# Patient Record
Sex: Male | Born: 1990
Health system: Southern US, Community
[De-identification: ages and names within clinical notes are randomized; demographics above are authoritative.]

---
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
## 2012-02-12 ENCOUNTER — Emergency Department (HOSPITAL_COMMUNITY)
Admission: EM | Admit: 2012-02-12 | Discharge: 2012-02-12 | Disposition: A | Discharge status: Home / Self Care | Payer: Medicaid Other | Source: Home / Self Care | Attending: Family Medicine | Admitting: Family Medicine

## 2012-02-12 ENCOUNTER — Encounter (HOSPITAL_COMMUNITY): Payer: Self-pay

## 2012-02-12 DIAGNOSIS — R112 Nausea with vomiting, unspecified: Secondary | ICD-10-CM

## 2012-02-12 MED ORDER — ONDANSETRON 4 MG PO TBDP
ORAL_TABLET | ORAL | Status: AC
  Filled 2012-02-12: qty 1

## 2012-02-12 MED ORDER — ONDANSETRON HCL 4 MG PO TABS: 4.0000 mg | ORAL_TABLET | Freq: Three times a day (TID) | ORAL | Status: AC | PRN

## 2012-02-12 MED ORDER — ONDANSETRON 4 MG PO TBDP
4.0000 mg | ORAL_TABLET | Freq: Once | ORAL | Status: AC
  Administered 2012-02-12: 4 mg via ORAL

## 2012-02-12 NOTE — Discharge Instructions (Signed)
Take medication (ondansetron for nausea) as instructed. Consume clear liquids such as water, Sprite, gingerale, light juices for the next 12 to 24 hours, then advance as tolerated to [employer] (bananas, rice, applesauce, toast) and bland things such as soup, crackers, etc. Stop taking medications and return if any blood in stool or any fevers, or you are unable to eat or drink.

## 2012-02-12 NOTE — ED Notes (Signed)
Pt has abd pain and has vomited x 2 today, denies diarrhea.

## 2012-02-12 NOTE — ED Provider Notes (Signed)
History     CSN: [id number]  Arrival date & time 02/12/12  1054   First MD Initiated Contact with Patient 02/12/12 1056      Chief Complaint  Patient presents with  . Emesis    (Consider location/radiation/quality/duration/timing/severity/associated sxs/prior treatment) HPI Comments: Travis Sims presents for evaluation of 2 episodes of vomiting this morning. He reports that he awoke this morning and vomited once. He waited, and then vomited again, prior to leaving his residence for work. He also reports generalized abdominal pain. He denies fever or diarrhea. He has not tried to eat or drink anything.  Patient is a 21 y.o. male presenting with vomiting. The history is provided by the patient.  Emesis  This is a new problem. The current episode started 3 to 5 hours ago. The problem occurs 2 to 4 times per day. The problem has not changed since onset.There has been no fever. Associated symptoms include abdominal pain. Pertinent negatives include no chills, no diarrhea and no fever.    History reviewed. No pertinent past medical history.  History reviewed. No pertinent past surgical history.  History reviewed. No pertinent family history.  History  Substance Use Topics  . Smoking status: Never Smoker   . Smokeless tobacco: Not on file  . Alcohol Use: Yes      Review of Systems  Constitutional: Negative.  Negative for fever and chills.  HENT: Negative.   Eyes: Negative.   Respiratory: Negative.   Cardiovascular: Negative.   Gastrointestinal: Positive for nausea, vomiting and abdominal pain. Negative for diarrhea and constipation.  Genitourinary: Negative.   Musculoskeletal: Negative.   Skin: Negative.   Neurological: Negative.     Allergies  Review of patient's allergies indicates no known allergies.  Home Medications   Current Outpatient Rx  Name Route Sig Dispense Refill  . ONDANSETRON HCL 4 MG PO TABS Oral Take 1 tablet (4 mg total) by mouth every 8 (eight) hours as  needed for nausea. 15 tablet 0    BP 121/78  Pulse 80  Temp(Src) 98.3 F (36.8 C) (Oral)  Resp 18  SpO2 97%  Physical Exam  Nursing note and vitals reviewed. Constitutional: He is oriented to person, place, and time. He appears well-developed and well-nourished.  HENT:  Head: Normocephalic and atraumatic.  Eyes: EOM are normal.  Neck: Normal range of motion.  Pulmonary/Chest: Effort normal.  Abdominal: Soft. Normal appearance and bowel sounds are normal. There is no tenderness.  Musculoskeletal: Normal range of motion.  Neurological: He is alert and oriented to person, place, and time.  Skin: Skin is warm and dry.  Psychiatric: His behavior is normal.    ED Course  Procedures (including critical care time)  Labs Reviewed - No data to display No results found.   1. Nausea and vomiting       MDM  Tolerated full can of gingerale after administration of 4 mg ondansetron ODT; given rx for ondansetron 4 mg ODT, BRAT diet        Renaee Munda, MD 02/12/12 1146

## 2014-12-10 ENCOUNTER — Emergency Department: Payer: Self-pay | Admitting: Emergency Medicine

## 2020-05-27 ENCOUNTER — Ambulatory Visit (HOSPITAL_COMMUNITY)
Admission: RE | Admit: 2020-05-27 | Discharge: 2020-05-27 | Disposition: A | Discharge status: Home / Self Care | Payer: Self-pay | Source: Ambulatory Visit | Attending: Urgent Care | Admitting: Urgent Care

## 2020-05-27 ENCOUNTER — Other Ambulatory Visit: Payer: Self-pay

## 2020-05-27 ENCOUNTER — Encounter (HOSPITAL_COMMUNITY): Payer: Self-pay

## 2020-05-27 VITALS — BP 135/80 | HR 85 | Temp 98.5°F | Resp 18

## 2020-05-27 DIAGNOSIS — Z202 Contact with and (suspected) exposure to infections with a predominantly sexual mode of transmission: Secondary | ICD-10-CM | POA: Insufficient documentation

## 2020-05-27 MED ORDER — CEFTRIAXONE SODIUM 500 MG IJ SOLR
INTRAMUSCULAR | Status: AC
  Filled 2020-05-27: qty 500

## 2020-05-27 MED ORDER — LIDOCAINE HCL 2 % IJ SOLN
INTRAMUSCULAR | Status: AC
  Filled 2020-05-27: qty 20

## 2020-05-27 MED ORDER — CEFTRIAXONE SODIUM 500 MG IJ SOLR
500.0000 mg | Freq: Once | INTRAMUSCULAR | Status: AC
  Administered 2020-05-27: 500 mg via INTRAMUSCULAR

## 2020-05-27 NOTE — Discharge Instructions (Signed)
Avoid all forms of sexual intercourse (oral, vaginal, anal) for the next 7 days to avoid spreading/reinfecting. Return if symptoms worsen/do not resolve, you develop fever, abdominal pain, blood in your urine, or are re-exposed to an STI.  

## 2020-05-27 NOTE — ED Triage Notes (Signed)
Pt here for STD treatment after being told sexual partner had gonorrhea

## 2020-05-27 NOTE — ED Provider Notes (Signed)
  [facility]   MRN: [id number] DOB: [birth year]  Subjective:   Travis Sims. is a 29 y.o. male presenting for treatment for gonorrhea.  Patient has a sex partner that tested positive for this. Denies dysuria, hematuria, urinary frequency, penile discharge, penile swelling, testicular pain, testicular swelling, anal pain, groin pain.  He is not currently taking any medications and has no known food or drug allergies.  Denies past medical and surgical history.   History reviewed. No pertinent family history.  Social History   Tobacco Use  . Smoking status: Never Smoker  . Smokeless tobacco: Never Used  Substance Use Topics  . Alcohol use: Not Currently  . Drug use: Never    ROS   Objective:   Vitals: BP 135/80 (BP Location: Right Arm)   Pulse 85   Temp 98.5 F (36.9 C) (Oral)   Resp 18   SpO2 97%   Physical Exam Constitutional:      General: He is not in acute distress.    Appearance: Normal appearance. He is well-developed and normal weight. He is not ill-appearing, toxic-appearing or diaphoretic.  HENT:     Head: Normocephalic and atraumatic.     Right Ear: External ear normal.     Left Ear: External ear normal.     Nose: Nose normal.     Mouth/Throat:     Pharynx: Oropharynx is clear.  Eyes:     General: No scleral icterus.       Right eye: No discharge.        Left eye: No discharge.     Extraocular Movements: Extraocular movements intact.     Pupils: Pupils are equal, round, and reactive to light.  Cardiovascular:     Rate and Rhythm: Normal rate.  Pulmonary:     Effort: Pulmonary effort is normal.  Genitourinary:    Penis: Circumcised. No phimosis, paraphimosis, hypospadias, erythema, tenderness, discharge, swelling or lesions.   Musculoskeletal:     Cervical back: Normal range of motion.  Neurological:     Mental Status: He is alert and oriented to person, place, and time.  Psychiatric:        Mood and Affect: Mood normal.          Behavior: Behavior normal.        Thought Content: Thought content normal.        Judgment: Judgment normal.      Assessment and Plan :   PDMP not reviewed this encounter.  1. Exposure to STD   2. Exposure to gonorrhea     Empiric treatment with IM ceftriaxone for gonorrhea exposure.  Labs pending.   Counseled on safe sex practices including abstaining for 1 week following treatment.  Counseled patient on potential for adverse effects with medications prescribed/recommended today, ER and return-to-clinic precautions discussed, patient verbalized understanding.    Wallis Bamberg, PA-C 05/27/20 1411

## 2020-05-28 ENCOUNTER — Encounter (HOSPITAL_COMMUNITY): Payer: Self-pay

## 2020-05-28 LAB — CYTOLOGY, (ORAL, ANAL, URETHRAL) ANCILLARY ONLY
Chlamydia: NEGATIVE
Comment: NEGATIVE
Comment: NEGATIVE
Comment: NORMAL
Neisseria Gonorrhea: POSITIVE — AB
Trichomonas: NEGATIVE
